# Patient Record
Sex: Female | Born: 1975 | Race: White | Hispanic: No | Smoking: Current some day smoker
Health system: Southern US, Community
[De-identification: ages and names within clinical notes are randomized; demographics above are authoritative.]

## PROBLEM LIST (undated history)

## (undated) HISTORY — PX: NO PAST SURGERIES: SHX2092

---

## 2004-11-07 ENCOUNTER — Ambulatory Visit (HOSPITAL_COMMUNITY): Admission: RE | Admit: 2004-11-07 | Discharge: 2004-11-07 | Payer: Self-pay | Admitting: Gynecology

## 2004-12-19 ENCOUNTER — Ambulatory Visit (HOSPITAL_COMMUNITY): Admission: RE | Admit: 2004-12-19 | Discharge: 2004-12-19 | Payer: Self-pay | Admitting: Gynecology

## 2005-05-10 ENCOUNTER — Inpatient Hospital Stay (HOSPITAL_COMMUNITY): Admission: AD | Admit: 2005-05-10 | Discharge: 2005-05-12 | Payer: Self-pay | Admitting: Gynecology

## 2006-10-23 ENCOUNTER — Emergency Department: Payer: Self-pay | Admitting: Emergency Medicine

## 2006-12-18 ENCOUNTER — Ambulatory Visit: Payer: Self-pay | Admitting: Gynecology

## 2007-01-08 ENCOUNTER — Ambulatory Visit (HOSPITAL_COMMUNITY): Admission: RE | Admit: 2007-01-08 | Discharge: 2007-01-08 | Payer: Self-pay | Admitting: Gynecology

## 2007-01-11 ENCOUNTER — Ambulatory Visit: Payer: Self-pay | Admitting: Gynecology

## 2007-01-11 ENCOUNTER — Encounter (INDEPENDENT_AMBULATORY_CARE_PROVIDER_SITE_OTHER): Payer: Self-pay | Admitting: Gynecology

## 2007-02-08 ENCOUNTER — Ambulatory Visit: Payer: Self-pay | Admitting: Gynecology

## 2007-03-15 ENCOUNTER — Ambulatory Visit: Payer: Self-pay | Admitting: Gynecology

## 2007-03-15 ENCOUNTER — Ambulatory Visit (HOSPITAL_COMMUNITY): Admission: RE | Admit: 2007-03-15 | Discharge: 2007-03-15 | Payer: Self-pay | Admitting: Gynecology

## 2007-04-19 ENCOUNTER — Ambulatory Visit: Payer: Self-pay | Admitting: Obstetrics and Gynecology

## 2007-04-19 ENCOUNTER — Inpatient Hospital Stay (HOSPITAL_COMMUNITY): Admission: AD | Admit: 2007-04-19 | Discharge: 2007-04-19 | Payer: Self-pay | Admitting: Family Medicine

## 2007-06-18 ENCOUNTER — Ambulatory Visit: Payer: Self-pay | Admitting: Emergency Medicine

## 2007-10-16 ENCOUNTER — Ambulatory Visit: Payer: Self-pay | Admitting: Physician Assistant

## 2011-08-20 LAB — URINALYSIS, ROUTINE W REFLEX MICROSCOPIC
Bilirubin Urine: NEGATIVE
Glucose, UA: 100 — AB
Hgb urine dipstick: NEGATIVE
Ketones, ur: NEGATIVE
Protein, ur: NEGATIVE

## 2011-08-20 LAB — URINE MICROSCOPIC-ADD ON

## 2014-01-03 ENCOUNTER — Ambulatory Visit: Payer: Self-pay | Admitting: Obstetrics and Gynecology

## 2014-01-12 ENCOUNTER — Ambulatory Visit: Payer: Self-pay | Admitting: Obstetrics and Gynecology

## 2019-06-24 ENCOUNTER — Ambulatory Visit
Admission: EM | Admit: 2019-06-24 | Discharge: 2019-06-24 | Disposition: A | Discharge status: Home / Self Care | Payer: Federal, State, Local not specified - PPO

## 2019-06-24 ENCOUNTER — Other Ambulatory Visit: Payer: Self-pay

## 2019-06-24 DIAGNOSIS — T63441A Toxic effect of venom of bees, accidental (unintentional), initial encounter: Secondary | ICD-10-CM

## 2019-06-24 MED ORDER — FAMOTIDINE 20 MG PO TABS
20.0000 mg | ORAL_TABLET | Freq: Once | ORAL | Status: AC
  Administered 2019-06-24: 20 mg via ORAL

## 2019-06-24 MED ORDER — PREDNISONE 50 MG PO TABS
60.0000 mg | ORAL_TABLET | Freq: Once | ORAL | Status: AC
  Administered 2019-06-24: 60 mg via ORAL

## 2019-06-24 MED ORDER — DIPHENHYDRAMINE HCL 25 MG PO CAPS
25.0000 mg | ORAL_CAPSULE | Freq: Once | ORAL | Status: AC
  Administered 2019-06-24: 25 mg via ORAL
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
## 2019-06-24 NOTE — ED Triage Notes (Signed)
Patient states that she was stung by a yellow jacket around 545 last night on left foot/ankle. States that pain radiates up her leg.

## 2019-06-24 NOTE — Discharge Instructions (Signed)
It was very nice seeing you today in clinic. Thank you for entrusting me with your care.   You were treated in clinic with steroids and histamine blockers. I would have you continue as follows for the next few days while you are having symptoms: Benadryl 25 mg as needed per package instructions Pepcid 20 mg daily   If your symptoms/condition worsens, please seek follow up care either here or in the ER. Please remember, our [place] providers are "right here with you" when you need [place].   Again, it was my pleasure to take care of you today. Thank you for choosing our clinic. I hope that you start to feel better quickly.   Honor Loh, MSN, APRN, FNP-C, CEN Advanced Practice Provider [facility]

## 2019-09-02 ENCOUNTER — Other Ambulatory Visit: Payer: Self-pay

## 2019-09-02 DIAGNOSIS — Z20822 Contact with and (suspected) exposure to covid-19: Secondary | ICD-10-CM

## 2019-09-03 LAB — NOVEL CORONAVIRUS, NAA: SARS-CoV-2, NAA: NOT DETECTED

## 2019-09-23 ENCOUNTER — Emergency Department: Payer: Federal, State, Local not specified - PPO | Attending: Emergency Medicine

## 2019-09-23 ENCOUNTER — Emergency Department
Admission: EM | Admit: 2019-09-23 | Discharge: 2019-09-23 | Disposition: A | Discharge status: Home / Self Care | Attending: Emergency Medicine | Admitting: Emergency Medicine

## 2019-09-23 ENCOUNTER — Other Ambulatory Visit: Payer: Self-pay

## 2019-09-23 ENCOUNTER — Encounter: Payer: Self-pay | Admitting: Emergency Medicine

## 2019-09-23 DIAGNOSIS — Y92410 Unspecified street and highway as the place of occurrence of the external cause: Secondary | ICD-10-CM | POA: Insufficient documentation

## 2019-09-23 DIAGNOSIS — Y999 Unspecified external cause status: Secondary | ICD-10-CM | POA: Diagnosis not present

## 2019-09-23 DIAGNOSIS — Y93I9 Activity, other involving external motion: Secondary | ICD-10-CM | POA: Diagnosis not present

## 2019-09-23 DIAGNOSIS — M542 Cervicalgia: Secondary | ICD-10-CM | POA: Diagnosis not present

## 2019-09-23 DIAGNOSIS — M25521 Pain in right elbow: Secondary | ICD-10-CM | POA: Diagnosis not present

## 2019-09-23 DIAGNOSIS — M545 Low back pain: Secondary | ICD-10-CM | POA: Insufficient documentation

## 2019-09-23 MED ORDER — METHOCARBAMOL 500 MG PO TABS: 500.0000 mg | ORAL_TABLET | Freq: Three times a day (TID) | ORAL | 0 refills | Status: AC | PRN

## 2019-09-23 MED ORDER — KETOROLAC TROMETHAMINE 30 MG/ML IJ SOLN
30.0000 mg | Freq: Once | INTRAMUSCULAR | Status: AC
  Administered 2019-09-23: 20:00:00 30 mg via INTRAMUSCULAR
  Filled 2019-09-23: qty 1

## 2019-09-23 MED ORDER — MELOXICAM 15 MG PO TABS: 15.0000 mg | ORAL_TABLET | Freq: Every day | ORAL | 1 refills | Status: AC

## 2019-09-23 MED ORDER — ACETAMINOPHEN 325 MG PO TABS
650.0000 mg | ORAL_TABLET | Freq: Once | ORAL | Status: AC
Start: 2019-09-23 — End: 2019-09-23
  Administered 2019-09-23: 650 mg via ORAL
  Filled 2019-09-23: qty 2
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
## 2019-09-23 NOTE — ED Notes (Signed)
Pt up to restroom. Understands to notify this RN if any reaction symptoms occur within next 48mins post IM shot.

## 2019-09-28 ENCOUNTER — Other Ambulatory Visit: Payer: Self-pay | Admitting: Gerontology

## 2019-09-28 DIAGNOSIS — F0781 Postconcussional syndrome: Secondary | ICD-10-CM

## 2019-10-12 ENCOUNTER — Other Ambulatory Visit: Payer: Self-pay

## 2019-10-12 ENCOUNTER — Ambulatory Visit
Admission: RE | Admit: 2019-10-12 | Discharge: 2019-10-12 | Disposition: A | Discharge status: Home / Self Care | Payer: Federal, State, Local not specified - PPO | Source: Ambulatory Visit | Attending: Gerontology | Admitting: Gerontology

## 2019-10-12 DIAGNOSIS — F0781 Postconcussional syndrome: Secondary | ICD-10-CM | POA: Diagnosis present

## 2019-10-12 MED ORDER — GADOBUTROL 1 MMOL/ML IV SOLN
6.0000 mL | Freq: Once | INTRAVENOUS | Status: AC | PRN
  Administered 2019-10-12: 6 mL via INTRAVENOUS

## 2020-01-29 ENCOUNTER — Ambulatory Visit: Payer: Federal, State, Local not specified - PPO | Attending: Internal Medicine

## 2020-01-29 DIAGNOSIS — Z23 Encounter for immunization: Secondary | ICD-10-CM

## 2020-01-29 NOTE — Progress Notes (Signed)
   Covid-19 Vaccination Clinic  Name:  AMOREE NEWLON    MRN: [id number] DOB: 1975-11-16  01/29/2020  Ms. Meath was observed post Covid-19 immunization for 15 minutes without incident. She was provided with Vaccine Information Sheet and instruction to access the V-Safe system.   Ms. Grant was instructed to call 911 with any severe reactions post vaccine: Marland Kitchen Difficulty breathing  . Swelling of face and throat  . A fast heartbeat  . A bad rash all over body  . Dizziness and weakness   Immunizations Administered    Name Date Dose VIS Date Route   Pfizer COVID-19 Vaccine 01/29/2020 12:18 PM 0.3 mL 10/14/2019 Intramuscular   Manufacturer: [employer], [employer]   Lot: [id number]   NDC: [address]-3

## 2020-02-26 ENCOUNTER — Other Ambulatory Visit: Payer: Self-pay

## 2020-02-26 ENCOUNTER — Ambulatory Visit: Payer: Federal, State, Local not specified - PPO | Attending: Internal Medicine

## 2020-02-26 DIAGNOSIS — Z23 Encounter for immunization: Secondary | ICD-10-CM

## 2020-02-26 NOTE — Progress Notes (Signed)
   Covid-19 Vaccination Clinic  Name:  Penny Wolf    MRN: [id number] DOB: 10/02/1976  02/26/2020  Penny Wolf was observed post Covid-19 immunization for 15 minutes without incident. She was provided with Vaccine Information Sheet and instruction to access the V-Safe system.   Penny Wolf was instructed to call 911 with any severe reactions post vaccine: Marland Kitchen Difficulty breathing  . Swelling of face and throat  . A fast heartbeat  . A bad rash all over body  . Dizziness and weakness   Immunizations Administered    Name Date Dose VIS Date Route   Pfizer COVID-19 Vaccine 02/26/2020  3:06 PM 0.3 mL 12/28/2018 Intramuscular   Manufacturer: [employer], [employer]   Lot: [id number]   NDC: [address]-2

## 2021-05-10 IMAGING — CR DG ELBOW COMPLETE 3+V*R*
1 series · 4 of 4 positions shown · non-contrast
Comparison: None.

CLINICAL DATA: Right elbow pain

EXAM:
RIGHT ELBOW - COMPLETE 3+ VIEW

[Series 1: dg elbow complete right (3+view) · 0.14mm/px · 4 of 4 slices shown]
[im 1/4]
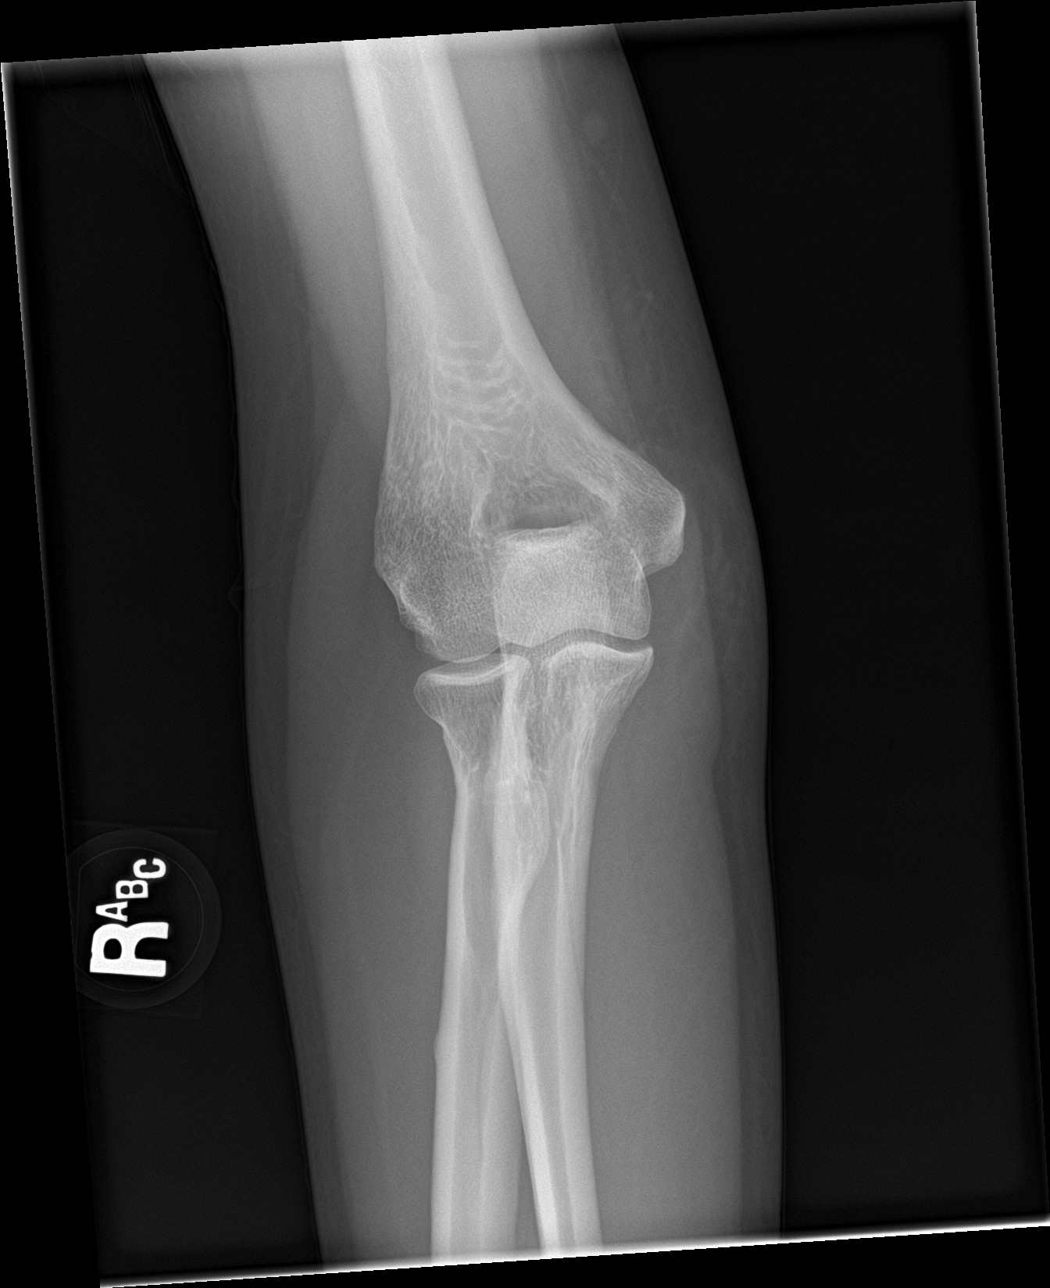
[im 2/4]
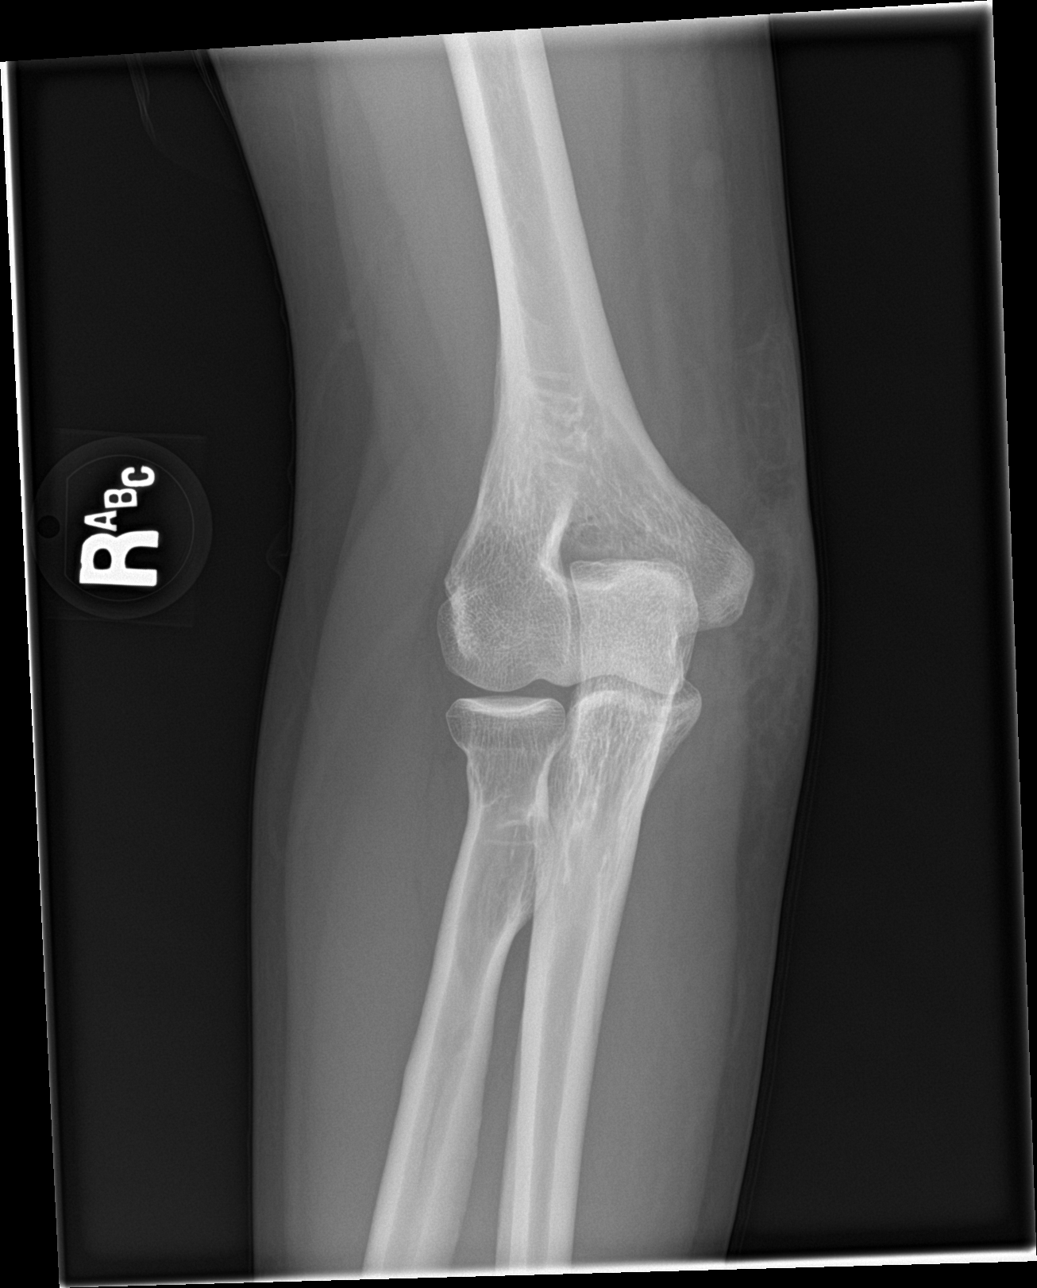
[im 3/4]
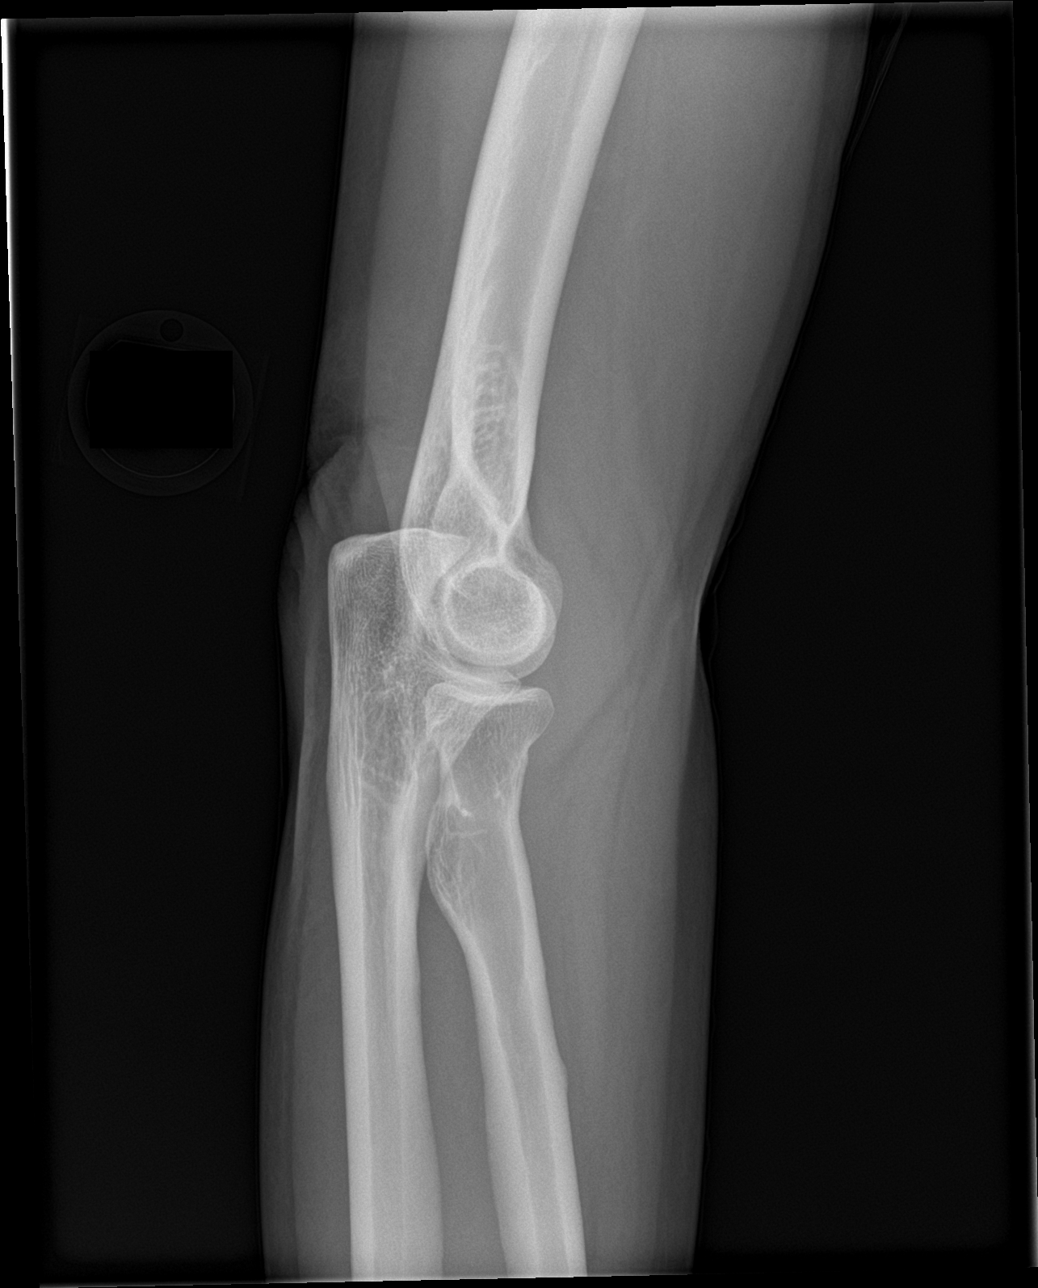
[im 4/4]
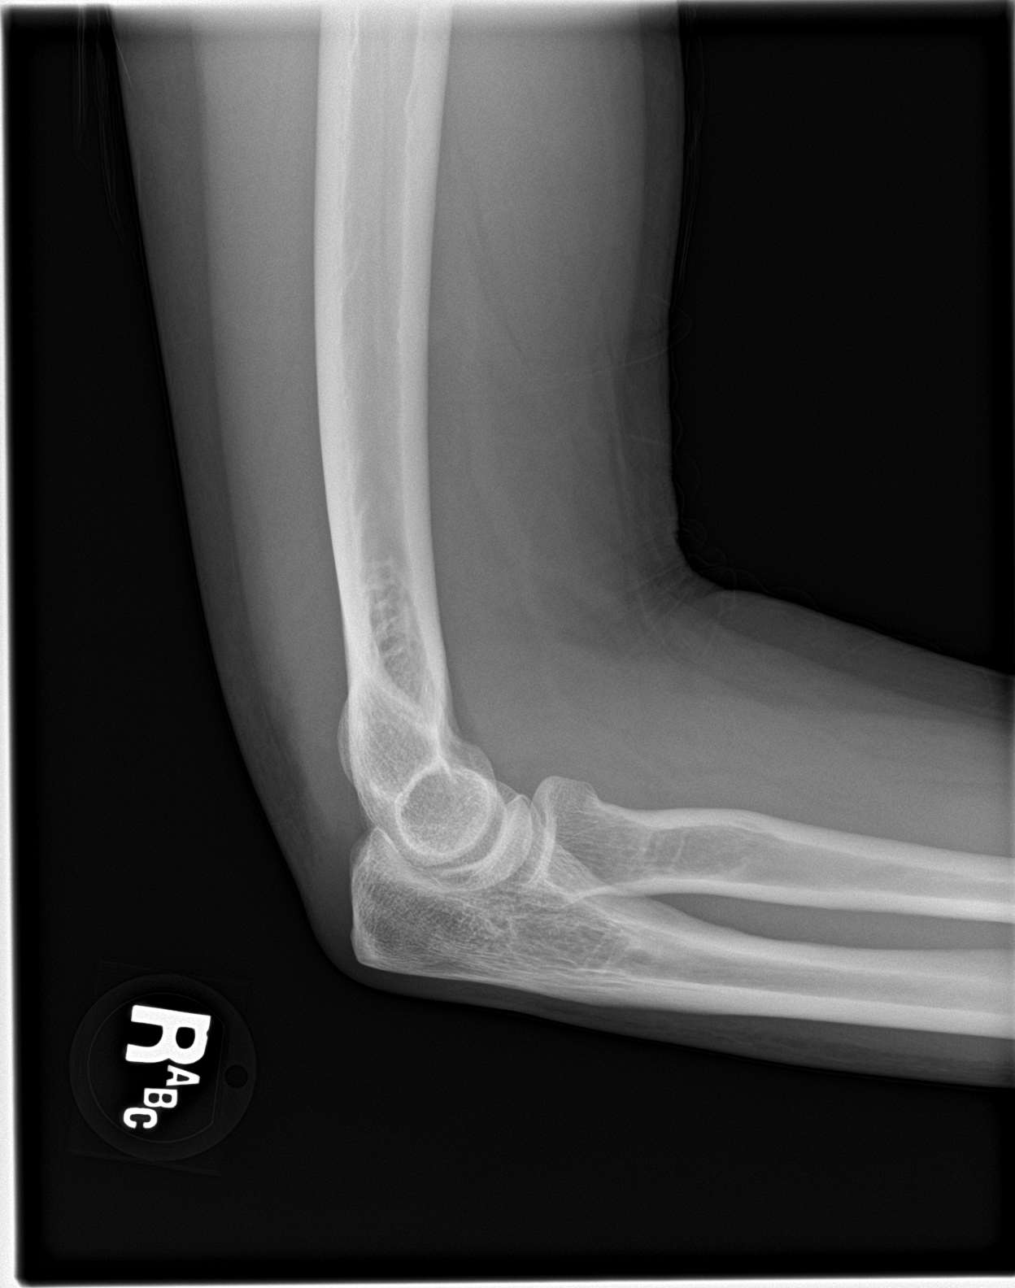

[4 of 4 positions shown; findings below may reference images not displayed]

FINDINGS: There is no evidence of fracture, dislocation, or joint effusion.
There is no evidence of arthropathy or other focal bone abnormality.
Soft tissues are unremarkable.
IMPRESSION: Negative.
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
## 2024-10-18 ENCOUNTER — Emergency Department

## 2024-10-18 ENCOUNTER — Emergency Department
Admission: EM | Admit: 2024-10-18 | Discharge: 2024-10-19 | Disposition: A | Attending: Emergency Medicine | Admitting: Emergency Medicine

## 2024-10-18 ENCOUNTER — Other Ambulatory Visit: Payer: Self-pay

## 2024-10-18 DIAGNOSIS — R0789 Other chest pain: Secondary | ICD-10-CM | POA: Diagnosis not present

## 2024-10-18 DIAGNOSIS — K8020 Calculus of gallbladder without cholecystitis without obstruction: Secondary | ICD-10-CM | POA: Diagnosis not present

## 2024-10-18 DIAGNOSIS — F17200 Nicotine dependence, unspecified, uncomplicated: Secondary | ICD-10-CM | POA: Diagnosis not present

## 2024-10-18 DIAGNOSIS — R079 Chest pain, unspecified: Secondary | ICD-10-CM

## 2024-10-18 DIAGNOSIS — R0602 Shortness of breath: Secondary | ICD-10-CM | POA: Diagnosis not present

## 2024-10-18 DIAGNOSIS — R1010 Upper abdominal pain, unspecified: Secondary | ICD-10-CM | POA: Diagnosis present

## 2024-10-18 LAB — CBC
HCT: 38.5 % (ref 36.0–46.0)
Hemoglobin: 13.7 g/dL (ref 12.0–15.0)
MCH: 30 pg (ref 26.0–34.0)
MCHC: 35.6 g/dL (ref 30.0–36.0)
MCV: 84.4 fL (ref 80.0–100.0)
Platelets: 253 K/uL (ref 150–400)
RBC: 4.56 MIL/uL (ref 3.87–5.11)
RDW: 12.3 % (ref 11.5–15.5)
WBC: 10.1 K/uL (ref 4.0–10.5)
nRBC: 0 % (ref 0.0–0.2)

## 2024-10-18 MED ORDER — MORPHINE SULFATE (PF) 4 MG/ML IV SOLN
4.0000 mg | Freq: Once | INTRAVENOUS | Status: DC
  Filled 2024-10-18: qty 1

## 2024-10-18 MED ORDER — PANTOPRAZOLE SODIUM 40 MG IV SOLR
40.0000 mg | Freq: Once | INTRAVENOUS | Status: AC
  Administered 2024-10-19: 40 mg via INTRAVENOUS
  Filled 2024-10-18: qty 10

## 2024-10-18 MED ORDER — SODIUM CHLORIDE 0.9 % IV BOLUS (SEPSIS)
1000.0000 mL | Freq: Once | INTRAVENOUS | Status: AC
  Administered 2024-10-19: 1000 mL via INTRAVENOUS

## 2024-10-18 MED ORDER — ONDANSETRON HCL 4 MG/2ML IJ SOLN
4.0000 mg | Freq: Once | INTRAMUSCULAR | Status: AC
  Administered 2024-10-19: 4 mg via INTRAVENOUS
  Filled 2024-10-18: qty 2

## 2024-10-18 NOTE — ED Triage Notes (Signed)
 Pt reports lower chest pain that radiates through to her back, pt reports pain started a few hours pta. Denies known cardiac hx. Pt repots some nausea and sob as well. Denies cough or congestion.

## 2024-10-18 NOTE — ED Provider Notes (Signed)
 [facility] Provider Note    Event Date/Time   First MD Initiated Contact with Patient 10/18/24 2321     (approximate)   History   Chest Pain   HPI  Penny Wolf is a 48 y.o. female with no significant past medical history presents to the emergency department complaints of chest pain, shortness of breath, nausea.  No fevers, cough or congestion.  No history of PE, DVT, recent fractures, surgery, trauma, hospitalization, prolonged travel or other immobilization. No lower extremity swelling or pain. No calf tenderness.  He does have a new the ring and is a smoker.  No family history of premature CAD.  States she is having some upper abdominal discomfort as well and thought maybe this was gas but when pain became severe she could not catch her breath and felt very hot.  She tried over-the-counter gas medication without relief and drinking ginger ale.  History provided by patient, family.    History reviewed. No pertinent past medical history.  Past Surgical History:  Procedure Laterality Date   NO PAST SURGERIES      MEDICATIONS:  Prior to Admission medications  Medication Sig Start Date End Date Taking? Authorizing Provider  etonogestrel-ethinyl estradiol (NUVARING) 0.12-0.015 MG/24HR vaginal ring Place 1 each vaginally every 28 (twenty-eight) days. Insert vaginally and leave in place for 3 consecutive weeks, then remove for 1 week.    [provider]    Physical Exam   Triage Vital Signs: ED Triage Vitals  Encounter Vitals Group     BP 10/18/24 2323 (!) 163/97     Girls Systolic BP Percentile --      Girls Diastolic BP Percentile --      Boys Systolic BP Percentile --      Boys Diastolic BP Percentile --      Pulse Rate 10/18/24 2323 68     Resp 10/18/24 2323 20     Temp 10/18/24 2323 97.9 F (36.6 C)     Temp src --      SpO2 10/18/24 2323 100 %     Weight 10/18/24 2323 140 lb (63.5 kg)     Height 10/18/24 2323 5' 5 (1.651 m)      Head Circumference --      Peak Flow --      Pain Score 10/18/24 2322 6     Pain Loc --      Pain Education --      Exclude from Growth Chart --     Most recent vital signs: Vitals:   10/19/24 0100 10/19/24 0130  BP: 104/77 111/79  Pulse: 78 72  Resp: 13 17  Temp:    SpO2: 100% 100%    CONSTITUTIONAL: Alert, responds appropriately to questions. Well-appearing; well-nourished HEAD: Normocephalic, atraumatic EYES: Conjunctivae clear, pupils appear equal, sclera nonicteric ENT: normal nose; moist mucous membranes NECK: Supple, normal ROM CARD: RRR; S1 and S2 appreciated RESP: Normal chest excursion without splinting or tachypnea; breath sounds clear and equal bilaterally; no wheezes, no rhonchi, no rales, no hypoxia or respiratory distress, speaking full sentences ABD/GI: Non-distended; soft, tender in the epigastric region and right upper quadrant, no guarding or rebound, no tenderness at McBurney's point BACK: The back appears normal EXT: Normal ROM in all joints; no deformity noted, no edema, no calf tenderness or calf swelling SKIN: Normal color for age and race; warm; no rash on exposed skin NEURO: Moves all extremities equally, normal speech PSYCH: The patient's mood and manner  are appropriate.   ED Results / Procedures / Treatments   LABS: (all labs ordered are listed, but only abnormal results are displayed) Labs Reviewed  BASIC METABOLIC PANEL WITH GFR - Abnormal; Notable for the following components:      Result Value   Potassium 3.3 (*)    Glucose, Bld 127 (*)    Calcium 8.8 (*)    All other components within normal limits  HEPATIC FUNCTION PANEL - Abnormal; Notable for the following components:   Total Protein 6.4 (*)    All other components within normal limits  CBC  HCG, QUANTITATIVE, PREGNANCY  LIPASE, BLOOD  D-DIMER, QUANTITATIVE  TROPONIN T, HIGH SENSITIVITY  TROPONIN T, HIGH SENSITIVITY     EKG:  EKG Interpretation Date/Time:  Tuesday  October 18 2024 23:26:43 EST Ventricular Rate:  67 PR Interval:  122 QRS Duration:  78 QT Interval:  393 QTC Calculation: 415 R Axis:   71  Text Interpretation: Sinus rhythm Baseline wander in lead(s) V1 Confirmed by Neomi Neptune [id number]) on 10/18/2024 11:42:43 PM         RADIOLOGY: My personal review and interpretation of imaging: Chest x-ray clear.  Patient has gallstones.  I have personally reviewed all radiology reports.   US  ABDOMEN LIMITED RUQ (LIVER/GB) Result Date: 10/19/2024 EXAM: Right Upper Quadrant Abdominal Ultrasound 10/19/2024 12:44:06 AM TECHNIQUE: Real-time ultrasonography of the right upper quadrant of the abdomen was performed. COMPARISON: None available. CLINICAL HISTORY: Upper abdominal pain. FINDINGS: LIVER: The liver demonstrates normal echogenicity. No intrahepatic biliary ductal dilatation. No evidence of mass. BILIARY SYSTEM: Gallbladder wall thickness measures 2 mm. Multiple gallstones are noted. Negative sonographic Beverley sign is elicited. No pericholecystic fluid. The common bile duct measures 3 mm IMPRESSION: 1. Cholelithiasis without sonographic evidence of acute cholecystitis. Electronically signed by: Oneil Devonshire MD 10/19/2024 12:53 AM EST RP Workstation: [id number]   DG Chest Port 1 View Result Date: 10/18/2024 EXAM: 1 VIEW(S) XRAY OF THE CHEST 10/18/2024 11:49:00 PM COMPARISON: None available. CLINICAL HISTORY: 355200 Chest pain [phone] FINDINGS: LUNGS AND PLEURA: No focal pulmonary opacity. No pleural effusion. No pneumothorax. HEART AND MEDIASTINUM: No acute abnormality of the cardiac and mediastinal silhouettes. BONES AND SOFT TISSUES: No acute osseous abnormality. IMPRESSION: 1. No acute process. Electronically signed by: Oneil Devonshire MD 10/18/2024 11:52 PM EST RP Workstation: [id number]     PROCEDURES:  Critical Care performed: No   .1-3 Lead EKG Interpretation  Performed by: Adriana Lina, Neptune SAILOR, DO Authorized by: Murial Beam, Neptune SAILOR, DO      Interpretation: normal     ECG rate:  71   ECG rate assessment: normal     Rhythm: sinus rhythm     Ectopy: none     Conduction: normal       IMPRESSION / MDM / ASSESSMENT AND PLAN / ED COURSE  I reviewed the triage vital signs and the nursing notes.    Patient here with chest pain, upper abdominal pain, shortness of breath.  The patient is on the cardiac monitor to evaluate for evidence of arrhythmia and/or significant heart rate changes.   DIFFERENTIAL DIAGNOSIS (includes but not limited to):   Cholelithiasis, cholecystitis, pancreatitis, gastritis, GERD, H. pylori, gastric ulcer, doubt perforation or appendicitis.  Differential also includes ACS, PE, less likely dissection.  Doubt CHF, pneumonia, pneumothorax.   Patient's presentation is most consistent with acute presentation with potential threat to life or bodily function.   PLAN: Will obtain labs, chest x-ray, right upper quadrant ultrasound.  Will give pain and  nausea medicine, IV fluids, Protonix .   MEDICATIONS GIVEN IN ED: Medications  ondansetron  (ZOFRAN ) injection 4 mg (4 mg Intravenous Given 10/19/24 0002)  sodium chloride  0.9 % bolus 1,000 mL (0 mLs Intravenous Stopped 10/19/24 0032)  pantoprazole  (PROTONIX ) injection 40 mg (40 mg Intravenous Given 10/19/24 0002)  ketorolac  (TORADOL ) 30 MG/ML injection 30 mg (30 mg Intravenous Given 10/19/24 0015)     ED COURSE: Labs show normal LFTs, lipase.  No leukocytosis.  First troponin negative.  Second pending.  D-dimer negative.  Right upper quadrant ultrasound reviewed and interpreted by myself and radiologist and shows gallstones without choledocholithiasis or cholecystitis.  Chest x-ray clear.  Suspect gallstones as the cause of her symptoms today.  She reports feeling much better.  If second troponin is negative, anticipate discharge home with outpatient general surgery follow-up information, prescriptions for pain and nausea medicine to take as needed, low-fat  diet.  1:56 AM  Pt's second troponin is negative.  Will discharge.   At this time, I do not feel there is any life-threatening condition present. I reviewed all nursing notes, vitals, pertinent previous records.  All lab and urine results, EKGs, imaging ordered have been independently reviewed and interpreted by myself.  I reviewed all available radiology reports from any imaging ordered this visit.  Based on my assessment, I feel the patient is safe to be discharged home without further emergent workup and can continue workup as an outpatient as needed. Discussed all findings, treatment plan as well as usual and customary return precautions.  They verbalize understanding and are comfortable with this plan.  Outpatient follow-up has been provided as needed.  All questions have been answered.  CONSULTS:  none   OUTSIDE RECORDS REVIEWED: Reviewed recent family medicine notes.       FINAL CLINICAL IMPRESSION(S) / ED DIAGNOSES   Final diagnoses:  Nonspecific chest pain  Gallstones     Rx / DC Orders   ED Discharge Orders          Ordered    oxyCODONE  (ROXICODONE ) 5 MG immediate release tablet  Every 8 hours PRN        10/19/24 0143    ondansetron  (ZOFRAN -ODT) 4 MG disintegrating tablet  Every 6 hours PRN        10/19/24 0143             Note:  This document was prepared using Dragon voice recognition software and may include unintentional dictation errors.   Janean Eischen, Josette SAILOR, DO 10/19/24 0157

## 2024-10-19 ENCOUNTER — Emergency Department

## 2024-10-19 LAB — HEPATIC FUNCTION PANEL
ALT: 8 U/L (ref 0–44)
AST: 15 U/L (ref 15–41)
Albumin: 4.1 g/dL (ref 3.5–5.0)
Alkaline Phosphatase: 77 U/L (ref 38–126)
Bilirubin, Direct: <0.1 mg/dL (ref 0.0–0.2)
Total Bilirubin: 0.2 mg/dL (ref 0.0–1.2)
Total Protein: 6.4 g/dL — ABNORMAL LOW (ref 6.5–8.1)

## 2024-10-19 LAB — BASIC METABOLIC PANEL WITH GFR
Anion gap: 14 (ref 5–15)
BUN: 13 mg/dL (ref 6–20)
CO2: 24 mmol/L (ref 22–32)
Calcium: 8.8 mg/dL — ABNORMAL LOW (ref 8.9–10.3)
Chloride: 104 mmol/L (ref 98–111)
Creatinine, Ser: 0.69 mg/dL (ref 0.44–1.00)
GFR, Estimated: >60 mL/min (ref >60)
Glucose, Bld: 127 mg/dL — ABNORMAL HIGH (ref 70–99)
Potassium: 3.3 mmol/L — ABNORMAL LOW (ref 3.5–5.1)
Sodium: 141 mmol/L (ref 135–145)

## 2024-10-19 LAB — TROPONIN T, HIGH SENSITIVITY
Troponin T High Sensitivity: <15 ng/L (ref 0–19)
Troponin T High Sensitivity: <15 ng/L (ref 0–19)

## 2024-10-19 LAB — HCG, QUANTITATIVE, PREGNANCY: hCG, Beta Chain, Quant, S: <1 m[IU]/mL (ref <5)

## 2024-10-19 LAB — D-DIMER, QUANTITATIVE: D-Dimer, Quant: 0.37 ug{FEU}/mL (ref 0.00–0.50)

## 2024-10-19 LAB — LIPASE, BLOOD: Lipase: 49 U/L (ref 11–51)

## 2024-10-19 MED ORDER — KETOROLAC TROMETHAMINE 30 MG/ML IJ SOLN
30.0000 mg | Freq: Once | INTRAMUSCULAR | Status: AC
  Administered 2024-10-19: 30 mg via INTRAVENOUS
  Filled 2024-10-19: qty 1

## 2024-10-19 MED ORDER — ONDANSETRON 4 MG PO TBDP: 4.0000 mg | ORAL_TABLET | Freq: Four times a day (QID) | ORAL | 0 refills | Status: DC | PRN

## 2024-10-19 MED ORDER — OXYCODONE HCL 5 MG PO TABS: 5.0000 mg | ORAL_TABLET | Freq: Three times a day (TID) | ORAL | 0 refills | Status: DC | PRN

## 2024-10-19 NOTE — ED Notes (Signed)
 Pt tolerating water.

## 2024-10-19 NOTE — ED Notes (Signed)
Patient verbalizes understanding of discharge instructions. Opportunity for questioning and answers were provided. Armband removed by staff, pt discharged from ED. Ambulated out to lobby with family ? ?

## 2024-10-19 NOTE — Discharge Instructions (Addendum)

## 2024-10-24 ENCOUNTER — Encounter: Payer: Self-pay | Admitting: Surgery

## 2024-10-24 ENCOUNTER — Ambulatory Visit: Payer: Self-pay | Admitting: Surgery

## 2024-10-24 VITALS — BP 129/83 | HR 90 | Ht 65.0 in | Wt 143.0 lb

## 2024-10-24 DIAGNOSIS — R1013 Epigastric pain: Secondary | ICD-10-CM

## 2024-10-24 DIAGNOSIS — R100 Acute abdomen: Secondary | ICD-10-CM

## 2024-10-24 NOTE — Progress Notes (Unsigned)
 Patient ID: Penny Wolf, female   DOB: 1975/12/18, 48 y.o.   MRN: [id number]  HPI Penny Wolf is a 48 y.o. female ***  HPI  History reviewed. No pertinent past medical history.  Past Surgical History:  Procedure Laterality Date   NO PAST SURGERIES      No family history on file.  Social History Social History[1]  Allergies[2]  Current Outpatient Medications  Medication Sig Dispense Refill   etonogestrel-ethinyl estradiol (NUVARING) 0.12-0.015 MG/24HR vaginal ring Place 1 each vaginally every 28 (twenty-eight) days. Insert vaginally and leave in place for 3 consecutive weeks, then remove for 1 week.     ondansetron  (ZOFRAN -ODT) 4 MG disintegrating tablet Take 1 tablet (4 mg total) by mouth every 6 (six) hours as needed for nausea or vomiting. (Patient not taking: Reported on 10/24/2024) 20 tablet 0   oxyCODONE  (ROXICODONE ) 5 MG immediate release tablet Take 1 tablet (5 mg total) by mouth every 8 (eight) hours as needed. (Patient not taking: Reported on 10/24/2024) 15 tablet 0   No current facility-administered medications for this visit.     Review of Systems Full ROS  was asked and was negative except for the information on the HPI  Physical Exam Blood pressure 129/83, pulse 90, height 5' 5 (1.651 m), weight 143 lb (64.9 kg), last menstrual period 09/16/2023, SpO2 98%. CONSTITUTIONAL: ***. EYES: Pupils are equal, round, Sclera are non-icteric. EARS, NOSE, MOUTH AND THROAT: The oropharynx is clear. The oral mucosa is pink and moist. Hearing is intact to voice. LYMPH NODES:  Lymph nodes in the neck are normal. RESPIRATORY:  Lungs are clear. There is normal respiratory effort, with equal breath sounds bilaterally, and without pathologic use of accessory muscles. CARDIOVASCULAR: Heart is regular without murmurs, gallops, or rubs. GI: The abdomen is *** soft, nontender, and nondistended. There are no palpable masses. There is no hepatosplenomegaly. There are normal  bowel sounds in all quadrants. GU: Rectal deferred.   MUSCULOSKELETAL: Normal muscle strength and tone. No cyanosis or edema.   SKIN: Turgor is good and there are no pathologic skin lesions or ulcers. NEUROLOGIC: Motor and sensation is grossly normal. Cranial nerves are grossly intact. PSYCH:  Oriented to person, place and time. Affect is normal.  Data Reviewed I have personally reviewed the patient's imaging, laboratory findings and medical records.    Assessment/Plan  I personally spent a total of 75 minutes in the care of the patient today including performing a medically appropriate exam/evaluation, counseling and educating, placing orders, referring and communicating with other health care professionals, documenting clinical information in the EHR, independently interpreting and reviewing images studies and coordinating care.    Laneta Luna, MD FACS General Surgeon 10/24/2024, 3:47 PM        [1] Social History Tobacco Use   Smoking status: Some Days    Types: Cigarettes    Passive exposure: Never   Smokeless tobacco: Never  Vaping Use   Vaping status: Never Used  Substance Use Topics   Alcohol use: Yes    Comment: occasionally   Drug use: Never  [2] No Known Allergies

## 2024-10-24 NOTE — Patient Instructions (Addendum)
 We will send a referral to [facility] for them to do an upper endoscopy and colonoscopy on you. They will call you to schedule this.   We would like for you to have a CT scan to better assess what is going on with you.    We will have you follow up here after we get the results of these exams.     We have scheduled you for a CT Scan of your Abdomen and Pelvis with contrast. This has been scheduled at [facility] on 10/31/24. Please arrive there by 12:00 pm. If you need to reschedule your Scan, you may do so by calling [phone]. Please let us  know if you reschedule your scan as we have to get authorization from your insurance for this.    Gallbladder Problems: Eating Plan Having high blood cholesterol, obesity, an inactive lifestyle, an unhealthy diet, or diabetes can put you at risk for getting gallstones. If you have a gallbladder problem, you may have trouble digesting fats. You may also have symptoms when you eat a lot of fat. Eating a low-fat diet can help lessen your symptoms. It may be helpful before and after having surgery to have your gallbladder taken out. Your health care provider may recommend that you work with an expert in healthy eating called a dietitian. They can help you lower the amount of fat in your diet. What are tips for following this plan? General guidelines Limit how much fat you have to less than 30% of your total daily calories. If you eat around 1,800 calories each day, you'll be eating less than 60 grams (g) of fat a day. Fat is an important part of a healthy diet. Eating a low-fat diet can make it hard to keep a healthy body weight. Ask your dietitian how much fat, calories, and other nutrients you need each day. Eat small meals often throughout the day instead of 3 large meals. Drink at least 8-10 cups (1.9-2.4 L) of fluid a day. If you drink alcohol: Limit how much you have to: 0-1 drink a day if you're female. 0-2 drinks a day if you're  female. Know how much alcohol is in your drink. In the [place] one drink is one 12 oz bottle of beer (355 mL), one 5 oz glass of wine (148 mL), or one 1 oz glass of hard liquor (44 mL). Reading food labels  Check nutrition facts on food labels for how much fat is in a serving. Choose foods with less than 3 grams of fat per serving. Shopping Choose nonfat and low-fat healthy foods. Look for the words nonfat, low-fat, or fat-free. Avoid buying processed or prepackaged foods. Cooking Cook using low-fat methods, such as baking, broiling, grilling, or boiling. Cook with small amounts of healthy fats, such as olive oil, grapeseed oil, canola oil, avocado oil, or sunflower oil. What foods are recommended?  All fresh, frozen, or canned fruits and vegetables. Whole grains. Low-fat or nonfat (skim) milk and yogurt. Lean meat, skinless poultry, fish, eggs, and beans. Low-fat protein supplement powders or drinks. Spices and herbs. The items listed above may not be all the foods and drinks you can have. Talk with a dietitian to learn more. What foods are not recommended? High-fat foods. These include baked goods, fast food, fatty cuts of meat, ice cream, french toast, sweet rolls, pizza, cheese bread, foods covered with butter, creamy sauces, or cheese. Fried foods. These include french fries, tempura, battered fish, breaded chicken, fried breads, and sweets.  Foods that cause bloating and gas. The items listed above may not be all the foods and drinks you should avoid. Talk with a dietitian to learn more. This information is not intended to replace advice given to you by your health care provider. Make sure you discuss any questions you have with your health care provider. Document Revised: 01/26/2024 Document Reviewed: 01/26/2024 Elsevier Patient Education  2025 [employer].

## 2024-10-31 ENCOUNTER — Ambulatory Visit
Admission: RE | Admit: 2024-10-31 | Discharge: 2024-10-31 | Disposition: A | Discharge status: Home / Self Care | Source: Ambulatory Visit | Attending: Surgery | Admitting: Surgery

## 2024-10-31 DIAGNOSIS — R1013 Epigastric pain: Secondary | ICD-10-CM | POA: Diagnosis present

## 2024-10-31 MED ORDER — IOHEXOL 300 MG/ML  SOLN
100.0000 mL | Freq: Once | INTRAMUSCULAR | Status: AC | PRN
  Administered 2024-10-31: 100 mL via INTRAVENOUS

## 2024-11-01 ENCOUNTER — Ambulatory Visit: Payer: Self-pay | Admitting: Surgery

## 2024-11-01 ENCOUNTER — Telehealth: Payer: Self-pay | Admitting: Surgery

## 2024-11-01 ENCOUNTER — Other Ambulatory Visit: Payer: Self-pay

## 2024-11-01 ENCOUNTER — Telehealth: Payer: Self-pay

## 2024-11-01 DIAGNOSIS — R1013 Epigastric pain: Secondary | ICD-10-CM

## 2024-11-01 NOTE — Telephone Encounter (Signed)
 Faxed referral to Dr. Alm Locust with [facility] at [phone] per pt's request.

## 2024-11-01 NOTE — Telephone Encounter (Signed)
 Pt is asking for a ref to [facility] for her endoscopy and colonoscopy. Says that we were to have placed one with someone else but she has not heard from anyone, but is needing the referral wants to be at [facility]. Pt # is [phone].

## 2024-11-01 NOTE — Telephone Encounter (Signed)
 Patient wants a new referral sent to Dr. Alm Locust, GI doctor with [facility] in [place]. She said that [facility] has never called her and at this point she is frustrated and would just prefer to go to [facility] at this time.  She is requesting specifically for Dr. Locust. Please call patient when referral has been placed as they told her they can get her worked in pretty quickly.

## 2024-11-23 ENCOUNTER — Ambulatory Visit: Admitting: Surgery

## 2024-11-23 ENCOUNTER — Encounter: Payer: Self-pay | Admitting: Surgery

## 2024-11-23 VITALS — BP 137/93 | HR 83 | Ht 65.0 in | Wt 140.0 lb

## 2024-11-23 DIAGNOSIS — K8020 Calculus of gallbladder without cholecystitis without obstruction: Secondary | ICD-10-CM | POA: Diagnosis not present

## 2024-11-23 DIAGNOSIS — K5900 Constipation, unspecified: Secondary | ICD-10-CM | POA: Diagnosis not present

## 2024-11-25 NOTE — Progress Notes (Signed)
 Outpatient Surgical Follow Up    Penny Wolf is an 49 y.o. female.   Chief Complaint  Patient presents with   Follow-up    HPI:  Penny Wolf is a 49 y.o. female  She is seen in f/u She recently came to the emergency room complaining of upper abdominal pain and right-sided chest pain.   We did order a CT scan of the abdomen pelvis that I personally reviewed showing evidence of constipation but no other acute intra-abdominal abnormalities.  I discussed with her in detail.  She was a little bit frustrated as she interpreted that with a discounted the importance of constipation.  I did clarify with her that was not our intent and we were rather happy to see that there was no any life-threatening or dangerous illnesses intra-abdominally She did see GI which I have personally reviewed the images.  Continues to have some irritable bowels.  She will follow-up with GI and she has to schedule endoscopic evaluation She drinks socially and smokes daily. She also had some on associated nausea and decreased appetite but no vomiting She denies any melena or hematochezia.  She also reports some episodes of watery bowel movements with constipation.  Lately he felt that her bowel movements had consistent similar to sand She did have appropriate workup in the ER including an ultrasound of the right upper quadrant that I personally reviewed showing evidence of gallstones without cholecystitis normal common bile duct.  CBC and CMP was normal. She is able to perform more than 4 METS of activity without shortness of breath or chest pain. She works for the [place]  [employer]    Past Surgical History:  Procedure Laterality Date   NO PAST SURGERIES        Social History:  reports that she has been smoking cigarettes. She has never been exposed to tobacco smoke. She has never used smokeless tobacco. She reports current alcohol use. She reports that she does not use drugs.  Allergies:  Allergies[1]  Medications reviewed.   Fam Hx: Mother and aunt biliary disease responded to cholecystectomy   ROS Full ROS performed and is otherwise negative other than what is stated in HPI   BP (!) 137/93   Pulse 83   Ht 5' 5 (1.651 m)   Wt 140 lb (63.5 kg)   LMP 09/16/2023 (Approximate)   SpO2 98%   BMI 23.30 kg/m   Physical Exam  CONSTITUTIONAL: NAD. EYES: Pupils are equal, round, Sclera are non-icteric. EARS, NOSE, MOUTH AND THROAT: The oropharynx is clear. The oral mucosa is pink and moist. Hearing is intact to voice. GI: The abdomen is  soft,mild tenderness and discomofrt on RUQ , no rebound , no murphy and nondistended. There are no palpable masses. There is no hepatosplenomegaly. There are normal bowel sounds GU: Rectal deferred.   MUSCULOSKELETAL: Normal muscle strength and tone. No cyanosis or edema.   SKIN: Turgor is good and there are no pathologic skin lesions or ulcers. NEUROLOGIC: Motor and sensation is grossly normal. Cranial nerves are grossly intact. PSYCH:  Oriented to person, place and time. Affect is normal.       Assessment/Plan: 49 year old female with some evidence of abdominal discomfort constipation and potentially irritable bowel syndrome.  In addition she does have evidence of gallstones.  Once again I had a good discussion with her regarding her disease process.  I emphasized that her symptoms are not clear-cut for biliary disease.  I also discussed with her the role  of cholecystectomy.  She will follow-up with GI for endoscopic evaluation and depending on findings may choose to come back for potential discussion of cholecystectomy.  She is currently not peritonitic not toxic does not require any surgical intervention or hospitalization at this time. I personally spent a total of 40 minutes in the care of the patient today including performing a medically appropriate exam/evaluation, counseling and educating, placing orders, referring and  communicating with other health care professionals, documenting clinical information in the EHR, independently interpreting and reviewing images studies and coordinating care.   Laneta Luna, MD FACS General Surgeon    [1] No Known Allergies
